# Patient Record
Sex: Male | Born: 1985 | Race: Black or African American | Hispanic: No | Marital: Married | Smoking: Former smoker
Health system: Southern US, Community
[De-identification: ages and names within clinical notes are randomized; demographics above are authoritative.]

## PROBLEM LIST (undated history)

## (undated) DIAGNOSIS — I10 Essential (primary) hypertension: Secondary | ICD-10-CM

---
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
## 2003-04-27 ENCOUNTER — Emergency Department (HOSPITAL_COMMUNITY): Admission: EM | Admit: 2003-04-27 | Discharge: 2003-04-27 | Payer: Self-pay | Admitting: Emergency Medicine

## 2003-04-27 ENCOUNTER — Encounter: Payer: Self-pay | Admitting: Emergency Medicine

## 2004-07-12 ENCOUNTER — Emergency Department (HOSPITAL_COMMUNITY): Admission: EM | Admit: 2004-07-12 | Discharge: 2004-07-12 | Payer: Self-pay | Admitting: Family Medicine

## 2004-10-24 ENCOUNTER — Emergency Department (HOSPITAL_COMMUNITY): Admission: EM | Admit: 2004-10-24 | Discharge: 2004-10-24 | Payer: Self-pay | Admitting: Family Medicine

## 2004-11-23 ENCOUNTER — Emergency Department (HOSPITAL_COMMUNITY): Admission: EM | Admit: 2004-11-23 | Discharge: 2004-11-23 | Payer: Self-pay | Admitting: Emergency Medicine

## 2005-01-22 ENCOUNTER — Emergency Department (HOSPITAL_COMMUNITY): Admission: EM | Admit: 2005-01-22 | Discharge: 2005-01-22 | Payer: Self-pay | Admitting: Emergency Medicine

## 2005-01-23 ENCOUNTER — Emergency Department (HOSPITAL_COMMUNITY): Admission: EM | Admit: 2005-01-23 | Discharge: 2005-01-23 | Payer: Self-pay | Admitting: Emergency Medicine

## 2005-05-14 IMAGING — CR DG HAND COMPLETE 3+V*R*
3 series · 3 of 3 positions shown · non-contrast
Comparison: none

CLINICAL DATA: pain and swelling status post injury
 RIGHT HAND- THREE VIEWS:

[view not recorded (1 of 3)]
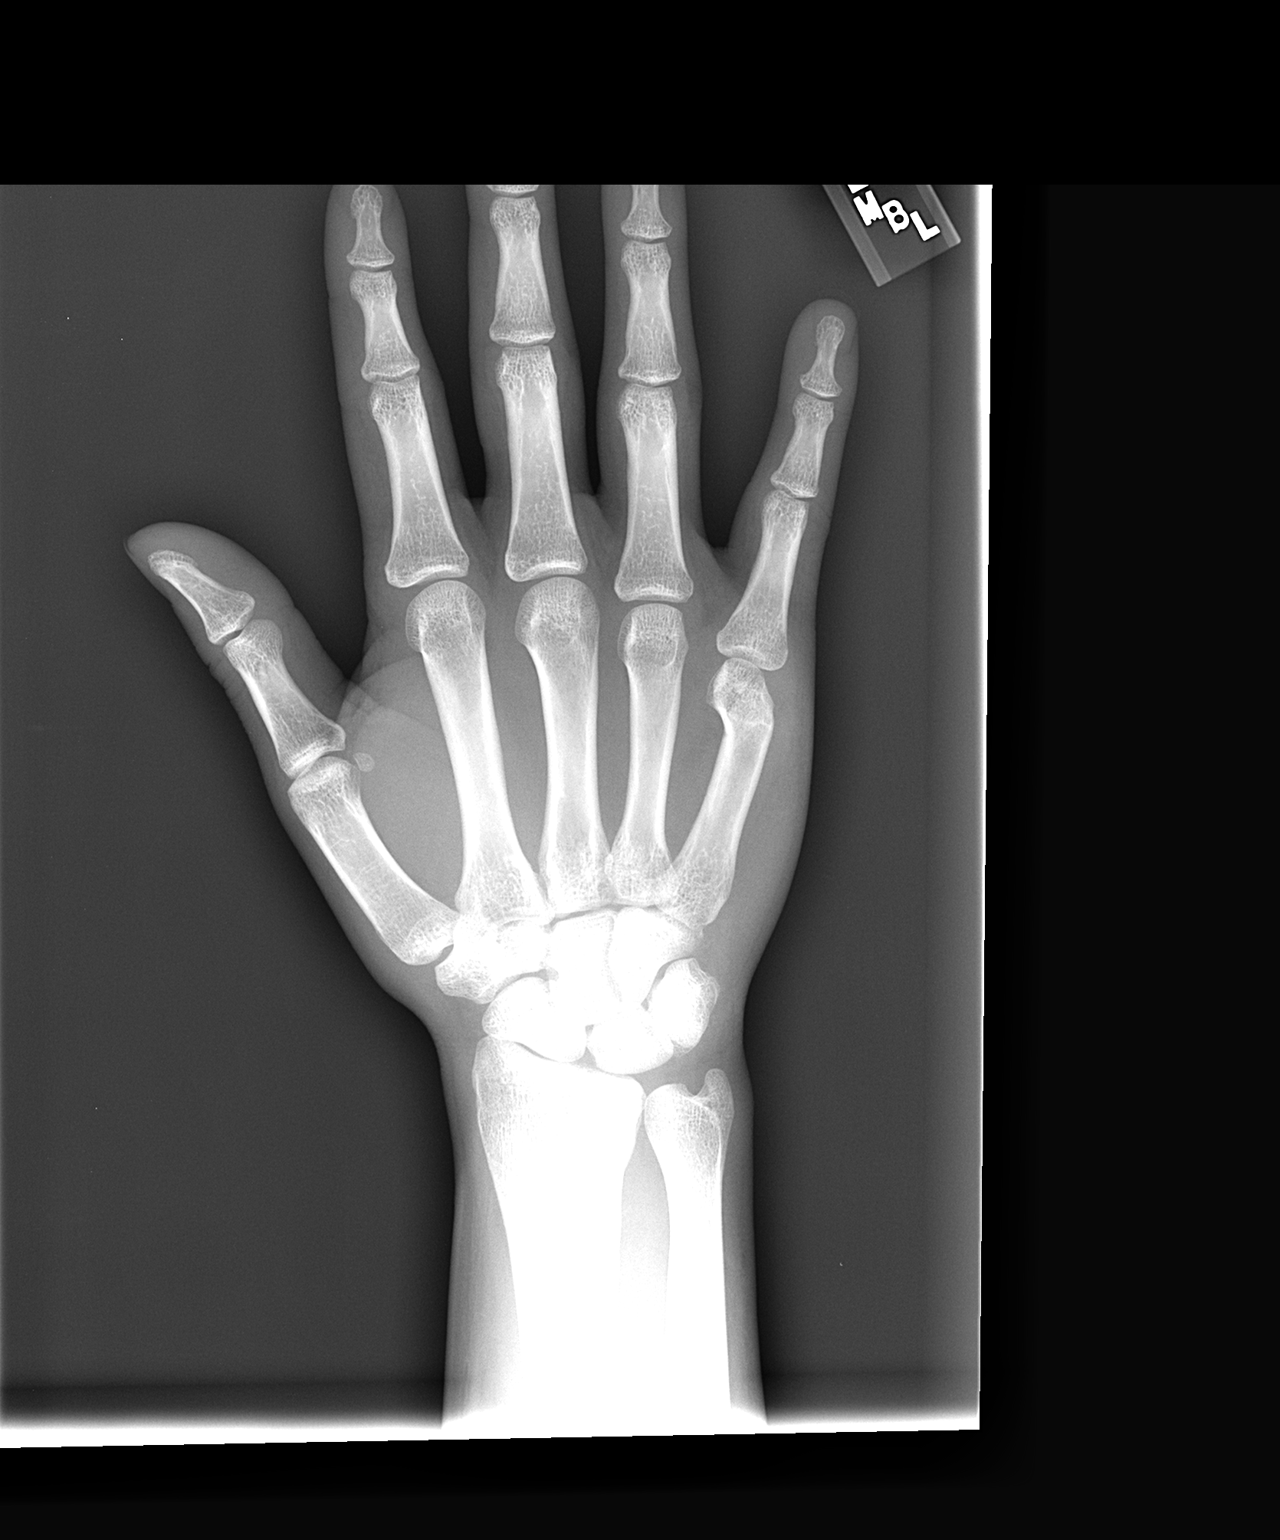

[view not recorded (2 of 3)]
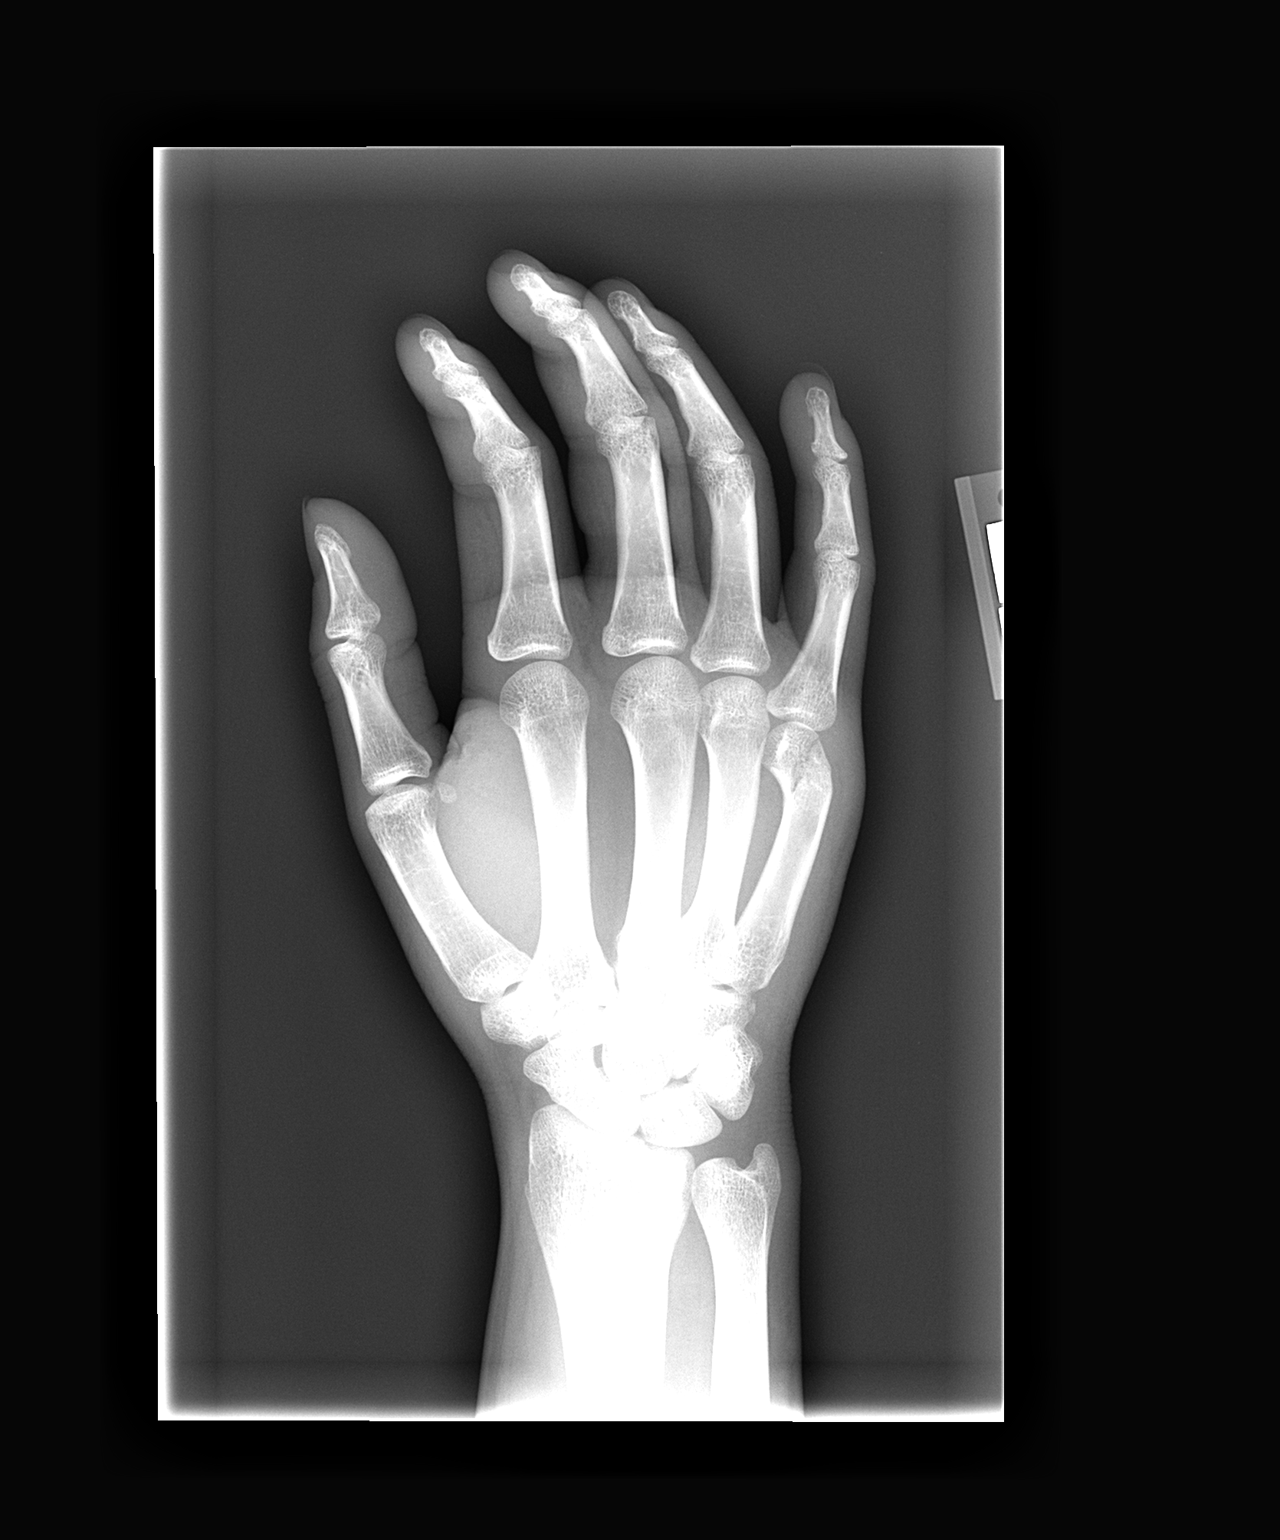

[view not recorded (3 of 3)]
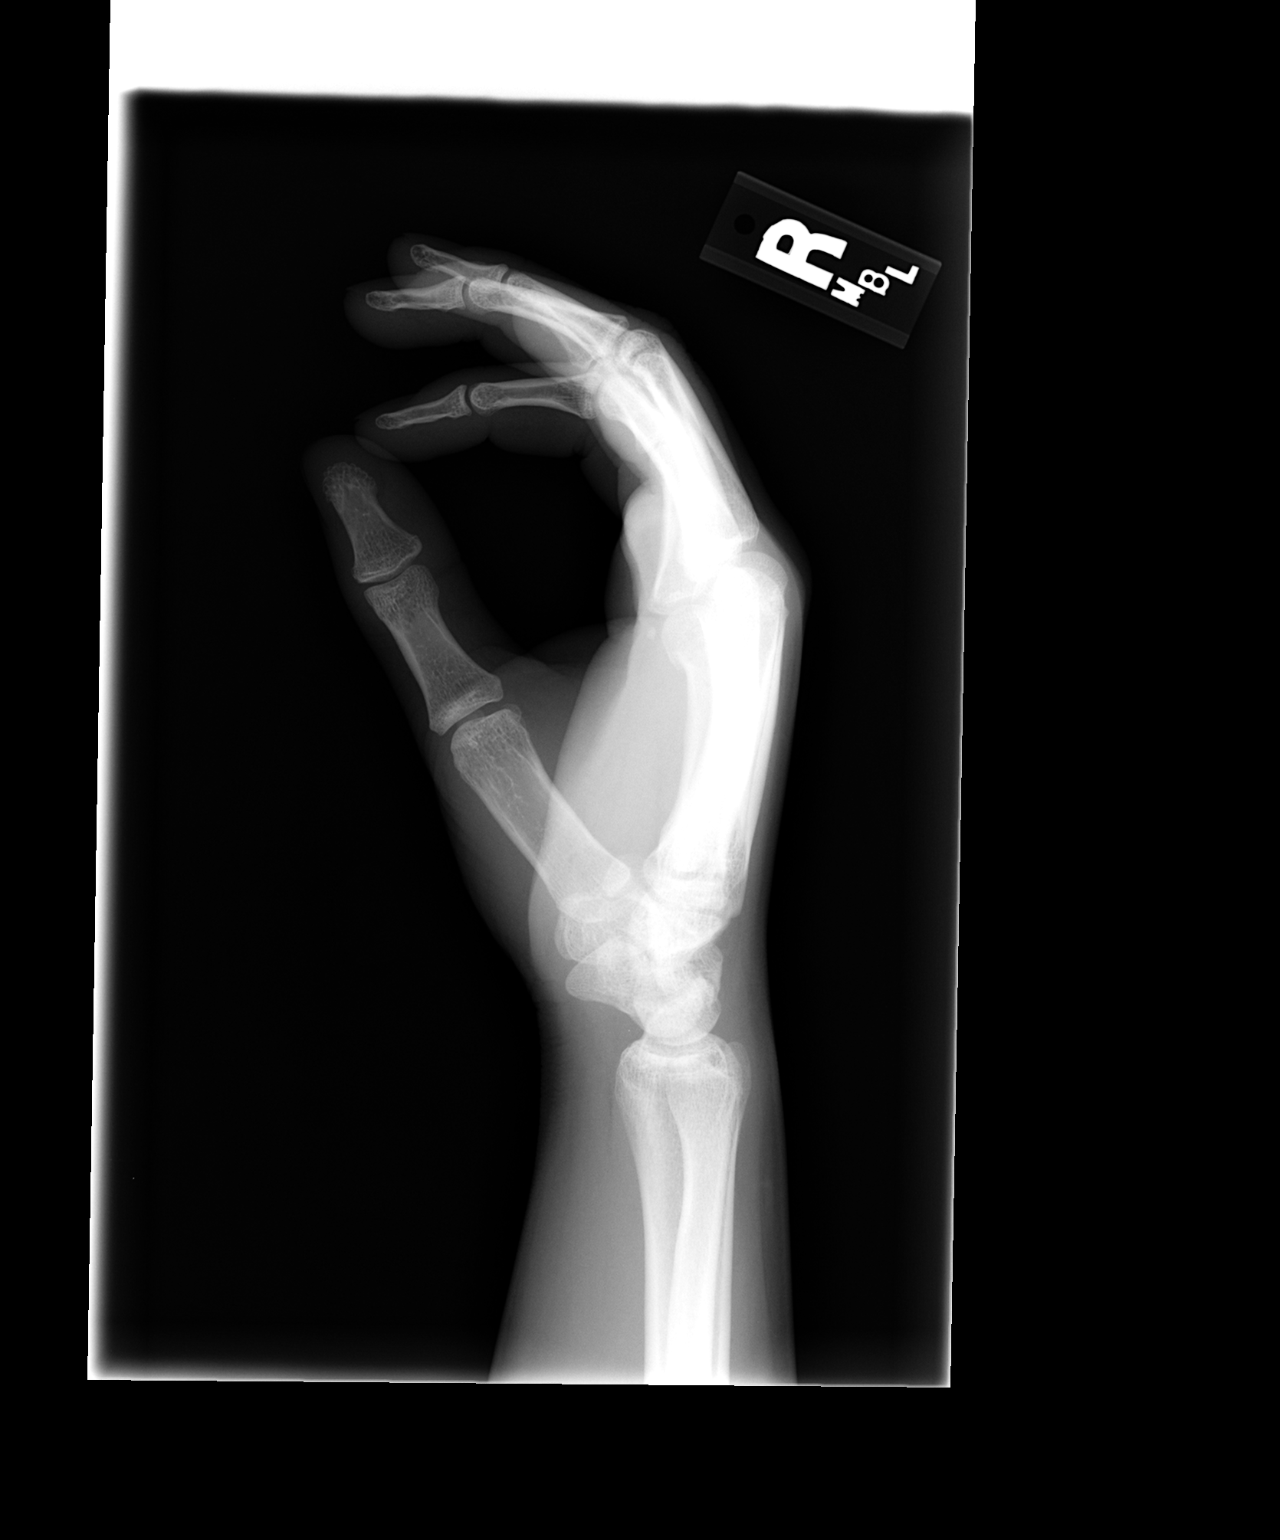

[3 of 3 positions shown; findings below may reference images not displayed]

FINDINGS: Fracture of the distal right fifth metacarpal is noted.  Mild angulation is identified.  This fracture appears to enter the MCP joint.  There is no evidence of subluxation or dislocation.
IMPRESSION: Distal fifth metacarpal fracture.

## 2005-11-20 ENCOUNTER — Emergency Department (HOSPITAL_COMMUNITY): Admission: EM | Admit: 2005-11-20 | Discharge: 2005-11-20 | Payer: Self-pay | Admitting: Family Medicine

## 2006-08-27 ENCOUNTER — Emergency Department (HOSPITAL_COMMUNITY): Admission: EM | Admit: 2006-08-27 | Discharge: 2006-08-27 | Payer: Self-pay | Admitting: Emergency Medicine

## 2011-04-15 LAB — CBC WITH AUTOMATED DIFF
ABS. BASOPHILS: 0 10*3/uL (ref 0.0–0.06)
ABS. EOSINOPHILS: 0.2 10*3/uL (ref 0.0–0.4)
ABS. LYMPHOCYTES: 1.9 10*3/uL (ref 0.9–3.6)
ABS. MONOCYTES: 0.6 10*3/uL (ref 0.05–1.2)
ABS. NEUTROPHILS: 5.4 10*3/uL (ref 1.8–8.0)
BASOPHILS: 1 % (ref 0–2)
EOSINOPHILS: 3 % (ref 0–5)
HCT: 45.1 % (ref 36.0–48.0)
HGB: 15.5 g/dL (ref 13.0–16.0)
LYMPHOCYTES: 23 % (ref 21–52)
MCH: 30 PG (ref 24.0–34.0)
MCHC: 34.4 g/dL (ref 31.0–37.0)
MCV: 87.4 FL (ref 74.0–97.0)
MONOCYTES: 7 % (ref 3–10)
MPV: 10.2 FL (ref 9.2–11.8)
NEUTROPHILS: 66 % (ref 40–73)
PLATELET: 236 10*3/uL (ref 135–420)
RBC: 5.16 M/uL (ref 4.70–5.50)
RDW: 13.5 % (ref 11.6–14.5)
WBC: 8.1 10*3/uL (ref 4.6–13.2)

## 2011-04-15 LAB — EKG, 12 LEAD, INITIAL
Atrial Rate: 59 {beats}/min
Atrial Rate: 56 {beats}/min
Calculated P Axis: 62 degrees
Calculated P Axis: 53 degrees
Calculated R Axis: 70 degrees
Calculated R Axis: 61 degrees
Calculated T Axis: 46 degrees
Calculated T Axis: 48 degrees
P-R Interval: 166 ms
P-R Interval: 158 ms
Q-T Interval: 384 ms
Q-T Interval: 388 ms
QRS Duration: 106 ms
QRS Duration: 106 ms
QTC Calculation (Bezet): 380 ms
QTC Calculation (Bezet): 374 ms
Ventricular Rate: 59 {beats}/min
Ventricular Rate: 56 {beats}/min

## 2011-04-15 LAB — METABOLIC PANEL, BASIC
Anion gap: 5 mmol/L (ref 5–15)
BUN/Creatinine ratio: 7 — ABNORMAL LOW (ref 12–20)
BUN: 8 MG/DL (ref 7–18)
CO2: 30 MMOL/L (ref 21–32)
Calcium: 9.5 MG/DL (ref 8.4–10.4)
Chloride: 102 MMOL/L (ref 100–108)
Creatinine: 1.2 MG/DL (ref 0.6–1.3)
GFR est AA: >60 mL/min/{1.73_m2} (ref >60)
GFR est non-AA: >60 mL/min/{1.73_m2} (ref >60)
Glucose: 96 MG/DL (ref 74–99)
Potassium: 3.8 MMOL/L (ref 3.5–5.5)
Sodium: 137 MMOL/L (ref 136–145)

## 2011-04-15 LAB — CARDIAC PANEL,(CK, CKMB & TROPONIN)
CK - MB: 0.9 ng/ml (ref 0.5–3.6)
CK-MB Index: 0.5 % (ref 0.0–4.0)
CK: 188 U/L (ref 39–308)
Troponin-I, QT: <0.04 NG/ML (ref 0.00–0.05)

## 2011-04-15 MED ORDER — ALBUTEROL 90 MCG/ACTUATION AEROSOL INHALER
90 mcg/actuation | RESPIRATORY_TRACT | Status: AC | PRN
Start: 2011-04-15 — End: ?

## 2011-04-15 MED ORDER — MOMETASONE 50 MCG/ACTUATION NASAL SPRAY
50 mcg/actuation | Freq: Every day | NASAL | Status: AC
Start: 2011-04-15 — End: ?

## 2011-04-15 MED ORDER — SODIUM CHLORIDE 0.9% BOLUS IV
0.9 % | Freq: Once | INTRAVENOUS | Status: AC
Start: 2011-04-15 — End: 2011-04-15
  Administered 2011-04-15: 15:00:00 via INTRAVENOUS

## 2011-04-15 MED ORDER — IPRATROPIUM-ALBUTEROL 2.5 MG-0.5 MG/3 ML NEB SOLUTION
2.5 mg-0.5 mg/3 ml | Freq: Once | RESPIRATORY_TRACT | Status: AC
Start: 2011-04-15 — End: 2011-04-15
  Administered 2011-04-15: 15:00:00 via RESPIRATORY_TRACT

## 2011-04-15 MED ORDER — PREDNISONE 50 MG TAB
50 mg | ORAL_TABLET | Freq: Every day | ORAL | Status: AC
Start: 2011-04-15 — End: 2011-04-19

## 2011-04-15 MED ORDER — ALBUTEROL SULFATE 0.083 % (0.83 MG/ML) SOLN FOR INHALATION
2.5 mg /3 mL (0.083 %) | RESPIRATORY_TRACT | Status: AC
Start: 2011-04-15 — End: 2011-04-15
  Administered 2011-04-15: 17:00:00 via RESPIRATORY_TRACT

## 2011-04-15 MED ORDER — PREDNISONE 20 MG TAB
20 mg | ORAL | Status: AC
Start: 2011-04-15 — End: 2011-04-15
  Administered 2011-04-15: 16:00:00 via ORAL

## 2011-04-15 MED ORDER — IPRATROPIUM-ALBUTEROL 2.5 MG-0.5 MG/3 ML NEB SOLUTION
2.5 mg-0.5 mg/3 ml | Freq: Two times a day (BID) | RESPIRATORY_TRACT | Status: DC
Start: 2011-04-15 — End: 2011-04-15
  Administered 2011-04-15: 14:00:00 via RESPIRATORY_TRACT

## 2011-04-15 NOTE — ED Notes (Signed)
Pt states cold type symptoms started a few days, having difficulty breathing and shortness of breath last night and worse this am;  Nonproductive cough with congestion, denies fever;

## 2011-04-15 NOTE — ED Provider Notes (Signed)
HPI Comments: 25 y.o. male presents to ED C/O difficulty breathing for the past two days. Pt reports coughing with CP, HA, rhinorrhea, and congestion. Pt Hx of nicotine use. Pt denies fever, chills, dysuria, abnormal BM, and any other Sx or complaints       Patient is a 25 y.o. male presenting with cough. The history is provided by the patient. No language interpreter was used.   Cough  This is a new problem. The current episode started 2 days ago. The problem occurs constantly. There has been no fever. Associated symptoms include chest pain, headaches, rhinorrhea and wheezing. Pertinent negatives include no sore throat, no nausea and no vomiting. Associated symptoms comments: Difficulty breathing.   Written by Lennart Pall, ED Scribe, as dictated by August Albino, MD.      No past medical history on file.     No past surgical history on file.      No family history on file.     History     Social History   ??? Marital Status: Married     Spouse Name: N/A     Number of Children: N/A   ??? Years of Education: N/A     Occupational History   ??? Not on file.     Social History Main Topics   ??? Smoking status: Not on file   ??? Smokeless tobacco: Not on file   ??? Alcohol Use: Not on file   ??? Drug Use: Not on file   ??? Sexually Active: Not on file     Other Topics Concern   ??? Not on file     Social History Narrative   ??? No narrative on file                  ALLERGIES: Aspirin and Penicillins      Review of Systems   Constitutional: Negative for fever, activity change, appetite change and unexpected weight change.   HENT: Positive for congestion and rhinorrhea. Negative for sore throat.    Respiratory: Positive for cough and wheezing.    Cardiovascular: Positive for chest pain.   Gastrointestinal: Negative for nausea, vomiting, abdominal pain and diarrhea.   Genitourinary: Negative for difficulty urinating.   Musculoskeletal: Negative for back pain.   Skin: Negative for rash.   Neurological: Positive for headaches.        Filed Vitals:    04/15/11 0901 04/15/11 0917 04/15/11 1141   BP: 143/80     Pulse: 63     Temp: 98.4 ??F (36.9 ??C)     Resp: 20     Height: 5\' 7"  (1.702 m)     Weight: 72.576 kg (160 lb)     SpO2: 100% 100% 100%            Physical Exam   Nursing note and vitals reviewed.  Constitutional: He is oriented to person, place, and time. He appears well-developed and well-nourished.   HENT:   Head: Normocephalic and atraumatic.   Right Ear: External ear normal.   Left Ear: External ear normal.   Nose: Nose normal.        rhinorrhea, and congestion  TTP frontal and maxillary sinus      Eyes: Conjunctivae and EOM are normal. Pupils are equal, round, and reactive to light.   Neck: Normal range of motion. Neck supple. No JVD present. No tracheal deviation present. No thyromegaly present.   Cardiovascular: Normal rate, regular rhythm, normal heart sounds and intact distal pulses.  Exam reveals no gallop and no friction rub.    No murmur heard.  Pulmonary/Chest: Effort normal. No respiratory distress. He has wheezes. He has no rales.        expiratory wheezing. no nasal flares.    Abdominal: Soft. Bowel sounds are normal. He exhibits no distension and no mass. There is no tenderness. There is no rebound and no guarding.   Musculoskeletal: Normal range of motion.   Neurological: He is alert and oriented to person, place, and time. He has normal reflexes. No cranial nerve deficit.   Skin: Skin is warm and dry. No rash noted. No erythema.        no erythema    Psychiatric: He has a normal mood and affect. His behavior is normal.        MDM     Differential Diagnosis; Clinical Impression; Plan:     DDx:  Amount and/or Complexity of Data Reviewed:   Clinical lab tests:  Ordered and reviewed  Tests in the radiology section of CPT??:  Ordered and reviewed (CXR)  Tests in the medicine section of the CPT??:  Ordered and reviewed (EKG)   Independant visualization of image, tracing, or specimen:  Yes      Procedures     Answered patient's questions regarding treatment plan.   Written by Lennart Pall, ED Scribe, as dictated by August Albino, MD.    ED EKG interpretation:  Sinus bradycardia with marked sinus arrhythmia 59 bpm with L ventricular hypertrophy read by Dot Lanes Brandy Kabat, MD at 1012.  Written by Lennart Pall, ED Scribe, as dictated by August Albino, MD.    ED EKG interpretation:  Sinus bradycardia 56 bpm with L ventricular hypertrophy read by Dot Lanes Kupono Marling, MD at 1245.  Written by Lennart Pall, ED Scribe, as dictated by August Albino, MD.       A Chest X-Ray was ordered. My reading of this film is no acute pulmonary process. (No comparison films available: pending review by Radiologist.)   Written by Lennart Pall, ED Scribe, as dictated by August Albino, MD.       Patients results have been reviewed with them.  Patient and/or family have verbally conveyed their understanding and agreement of the patient's signs, symptoms, diagnosis, treatment and prognosis and additionally agree to follow up as recommended or return to the Emergency Room should their condition change prior to their follow-up appointment. Patient verbally agrees with the care-plan and verbally conveys that all of their questions have been answered.   Discharge instructions have also been provided to the patient with some educational information regarding their diagnosis as well a list of reasons why they would want to return to the ER prior to their follow-up appointment should their condition change.     Labs Reviewed   METABOLIC PANEL, BASIC - Abnormal; Notable for the following:     BUN/Creatinine ratio 7 (*)     All other components within normal limits   CARDIAC PANEL,(CK, CKMB & TROPONIN)   CBC WITH AUTOMATED DIFF       Recent Results (from the past 12 hour(s))   EKG, 12 LEAD, INITIAL    Collection Time    04/15/11 10:10 AM       Component Value Range    Ventricular Rate 59      Atrial Rate 59      P-R Interval 166      QRS Duration 106       Q-T Interval 384  QTC Calculation (Bezet) 380      Calculated P Axis 62      Calculated R Axis 70      Calculated T Axis 46      Diagnosis        Value: Sinus bradycardia with marked sinus arrhythmia      Possible Right ventricular hypertrophy      Voltage criteria for left ventricular hypertrophy      ST elevation, consider early repolarization, pericarditis, or injury      Abnormal ECG      No previous ECGs available      Confirmed by [id number], Daune Perch A ([id number]) on 04/15/2011 11:07:05 AM   CARDIAC PANEL,(CK, CKMB & TROPONIN)    Collection Time    04/15/11 10:30 AM       Component Value Range    CK 188  39 - 308 (U/L)    CK - MB 0.9  0.5 - 3.6 (ng/ml)    CK-MB Index 0.5  0.0 - 4.0 (%)    Troponin-I, Qt. <0.04  0.00 - 0.05 (NG/ML)   CBC WITH AUTOMATED DIFF    Collection Time    04/15/11 10:30 AM       Component Value Range    WBC 8.1  4.6 - 13.2 (K/uL)    RBC 5.16  4.70 - 5.50 (M/uL)    HGB 15.5  13.0 - 16.0 (g/dL)    HCT 11.9  14.7 - 82.9 (%)    MCV 87.4  74.0 - 97.0 (FL)    MCH 30.0  24.0 - 34.0 (PG)    MCHC 34.4  31.0 - 37.0 (g/dL)    RDW 56.2  13.0 - 86.5 (%)    PLATELET 236  135 - 420 (K/uL)    MPV 10.2  9.2 - 11.8 (FL)    NEUTROPHILS 66  40 - 73 (%)    LYMPHOCYTES 23  21 - 52 (%)    MONOCYTES 7  3 - 10 (%)    EOSINOPHILS 3  0 - 5 (%)    BASOPHILS 1  0 - 2 (%)    ABS. NEUTROPHILS 5.4  1.8 - 8.0 (K/UL)    ABS. LYMPHOCYTES 1.9  0.9 - 3.6 (K/UL)    ABS. MONOCYTES 0.6  0.05 - 1.2 (K/UL)    ABS. EOSINOPHILS 0.2  0.0 - 0.4 (K/UL)    ABS. BASOPHILS 0.0  0.0 - 0.06 (K/UL)    DF AUTOMATED     METABOLIC PANEL, BASIC    Collection Time    04/15/11 10:30 AM       Component Value Range    Sodium 137  136 - 145 (MMOL/L)    Potassium 3.8  3.5 - 5.5 (MMOL/L)    Chloride 102  100 - 108 (MMOL/L)    CO2 30  21 - 32 (MMOL/L)    Anion gap 5  5 - 15 (mmol/L)    Glucose 96  74 - 99 (MG/DL)    BUN 8  7 - 18 (MG/DL)    Creatinine 1.2  0.6 - 1.3 (MG/DL)    BUN/Creatinine ratio 7 (*) 12 - 20 ( )     GFR est AA >60  >60 (ml/min/1.44m2)    GFR est non-AA >60  >60 (ml/min/1.66m2)    Calcium 9.5  8.4 - 10.4 (MG/DL)       CLINICAL IMPRESSION    1. Reactive airway disease    2. Nasal congestion

## 2011-04-15 NOTE — ED Notes (Signed)
I have reviewed discharge instructions with the patient.  The patient verbalized understanding. Patient armband removed and shredded

## 2013-12-16 MED ORDER — LIDOCAINE 2 % MUCOSAL SOLN
2 % | Freq: Four times a day (QID) | Status: AC | PRN
Start: 2013-12-16 — End: ?

## 2013-12-16 NOTE — ED Notes (Signed)
I have this itchy dry throat

## 2013-12-16 NOTE — ED Notes (Signed)
Discharge paperwork given to patient.  Patient verbalizes understanding.  Armband removed and shredded.   Patient educated regarding pain medication, dosage, administration and side effects.  Patient educated non-pharmacological pain intervention therapies.

## 2013-12-16 NOTE — ED Provider Notes (Signed)
HPI Comments:   3:14 PM  28 y.o. male presents to ED C/O itcy, scratchy sore throat x 1.5 weeks. Other sxs include mild dry cough. He has been using cough drops and drinking water without relief of sxs. He denies strep throat exposure, fevers, chills, nasal congestion, rhinorrhea, sneezing, chest pain, abdominal pain, and any other sxs or complaints.    Patient is a 28 y.o. male presenting with sore throat. The history is provided by the patient. No language interpreter was used.   Sore Throat   This is a new problem. The current episode started more than 1 week ago (1.5 weeks ago). The problem has not changed since onset.There has been no fever. Associated symptoms include cough (mild, dry). Pertinent negatives include no diarrhea, no vomiting, no congestion, no headaches and no shortness of breath. He has had no exposure to strep. Treatments tried: cough drops. The treatment provided no relief.   Written by Jani Files, ED Scribe, as dictated by Romero Liner, PA-C     History reviewed. No pertinent past medical history.     History reviewed. No pertinent past surgical history.      History reviewed. No pertinent family history.     History     Social History   ??? Marital Status: SINGLE     Spouse Name: N/A     Number of Children: N/A   ??? Years of Education: N/A     Occupational History   ??? Not on file.     Social History Main Topics   ??? Smoking status: Never Smoker    ??? Smokeless tobacco: Not on file   ??? Alcohol Use: 1.0 oz/week     2 Shots of liquor per week      Comment: daily drinks vodka   ??? Drug Use: No   ??? Sexual Activity: Not on file     Other Topics Concern   ??? Not on file     Social History Narrative                  ALLERGIES: Aspirin and Penicillins      Review of Systems   Constitutional: Negative for fever and chills.   HENT: Positive for sore throat. Negative for congestion and rhinorrhea.    Respiratory: Positive for cough (mild, dry). Negative for shortness of breath.    Cardiovascular:  Negative for chest pain.   Gastrointestinal: Negative for nausea, vomiting, abdominal pain and diarrhea.   Genitourinary: Negative for dysuria.   Musculoskeletal: Negative for myalgias and arthralgias.   Skin: Negative for rash.   Neurological: Negative for headaches.   Psychiatric/Behavioral: Negative for confusion.   All other systems reviewed and are negative.  Written by Jani Files, ED Scribe, as dictated by Romero Liner, PA-C    Filed Vitals:    12/16/13 1510   BP: 159/94   Pulse: 82   Temp: 97.9 ??F (36.6 ??C)   Resp: 16   Height: 5\' 6"  (1.676 m)   Weight: 70.761 kg (156 lb)   SpO2: 99%            Physical Exam   Constitutional: He appears well-developed and well-nourished. No distress.   HENT:   Head: Normocephalic and atraumatic.   Right Ear: Tympanic membrane, external ear and ear canal normal.   Left Ear: Tympanic membrane, external ear and ear canal normal.   Nose: Nose normal.   Mouth/Throat: Uvula is midline, oropharynx is clear and moist and mucous  membranes are normal. No oropharyngeal exudate, posterior oropharyngeal edema or posterior oropharyngeal erythema.   Eyes: Conjunctivae and EOM are normal. Pupils are equal, round, and reactive to light.   Neck: Trachea normal, normal range of motion and full passive range of motion without pain. Neck supple.   Cardiovascular: Normal rate, regular rhythm, normal heart sounds and normal pulses.    Pulmonary/Chest: Effort normal and breath sounds normal.   Abdominal: Soft. Normal appearance and bowel sounds are normal. There is no tenderness. There is no rebound and no guarding.   Musculoskeletal: Normal range of motion.   Neurological: He is alert. He has normal strength and normal reflexes.   Skin: Skin is warm and dry. He is not diaphoretic.   Psychiatric: He has a normal mood and affect. His speech is normal and behavior is normal. Judgment normal.   Nursing note and vitals reviewed.         RESULTS:    No orders to display        Labs Reviewed - No  data to display    No results found for this or any previous visit (from the past 12 hour(s)).    MDM  Number of Diagnoses or Management Options     Amount and/or Complexity of Data Reviewed  Clinical lab tests: ordered and reviewed  Tests in the radiology section of CPT??: ordered and reviewed        MEDICATIONS GIVEN:  Medications - No data to display    Procedures    PROGRESS NOTE:  3:14 PM  Initial assessment performed.  Written by Jani FilesJenna K Whitney, ED Scribe, as dictated by Romero Linerobert Daquavion Catala, PA-C    DISCHARGE NOTE:  Levan D Scafidi's  results have been reviewed with him.  He has been counseled regarding his diagnosis, treatment, and plan.  He verbally conveys understanding and agreement of the signs, symptoms, diagnosis, treatment and prognosis and additionally agrees to follow up as discussed.  He also agrees with the care-plan and conveys that all of his questions have been answered.  I have also provided discharge instructions for him that include: educational information regarding their diagnosis and treatment, and list of reasons why they would want to return to the ED prior to their follow-up appointment, should his condition change.    CLINICAL IMPRESSION:    1. Pharyngitis        PLAN:  1. D/C Home  2.   Current Discharge Medication List      START taking these medications    Details   lidocaine (LIDOCAINE VISCOUS) 2 % solution Take 5 mL by mouth four (4) times daily as needed for Pain. Mix with equal parts water.  Swish and swallow.  Qty: 1 Bottle, Refills: 0         CONTINUE these medications which have NOT CHANGED    Details   albuterol (PROVENTIL, VENTOLIN) 90 mcg/actuation inhaler Take 1-2 Puffs by inhalation every four (4) hours as needed for Wheezing.  Qty: 17 g, Refills: 0      mometasone (NASONEX) 50 mcg/actuation nasal spray 2 Sprays by Both Nostrils route daily.  Qty: 1 Container, Refills: 0         3.   Follow-up Information    Follow up With Details Comments Contact Info    [facility]  Schedule an appointment as soon as possible for a visit in 2 days Follow Up [address]  [place], [place] [id number]  [place] [id number]  [phone]  [facility]  As needed, If symptoms worsen [address]  [place] [id number]  [phone]          Written by Jani FilesJenna K Whitney, ED Scribe, as dictated by Romero Linerobert Jamarkis Branam, PA-C.

## 2013-12-18 LAB — STREP THROAT SCREEN: Strep Screen: NEGATIVE

## 2020-06-09 ENCOUNTER — Emergency Department (HOSPITAL_COMMUNITY)
Admission: EM | Admit: 2020-06-09 | Discharge: 2020-06-09 | Disposition: A | Discharge status: Home / Self Care | Attending: Emergency Medicine | Admitting: Emergency Medicine

## 2020-06-09 ENCOUNTER — Other Ambulatory Visit: Payer: Self-pay

## 2020-06-09 ENCOUNTER — Encounter (HOSPITAL_COMMUNITY): Payer: Self-pay | Admitting: Emergency Medicine

## 2020-06-09 DIAGNOSIS — F17200 Nicotine dependence, unspecified, uncomplicated: Secondary | ICD-10-CM | POA: Insufficient documentation

## 2020-06-09 DIAGNOSIS — T65891A Toxic effect of other specified substances, accidental (unintentional), initial encounter: Secondary | ICD-10-CM | POA: Insufficient documentation

## 2020-06-09 DIAGNOSIS — Y990 Civilian activity done for income or pay: Secondary | ICD-10-CM | POA: Insufficient documentation

## 2020-06-09 DIAGNOSIS — H10211 Acute toxic conjunctivitis, right eye: Secondary | ICD-10-CM | POA: Diagnosis not present

## 2020-06-09 DIAGNOSIS — X141XXA Other contact with hot air and other hot gases, initial encounter: Secondary | ICD-10-CM | POA: Diagnosis not present

## 2020-06-09 DIAGNOSIS — H5711 Ocular pain, right eye: Secondary | ICD-10-CM | POA: Diagnosis present

## 2020-06-09 MED ORDER — TETRACAINE HCL 0.5 % OP SOLN
2.0000 [drp] | Freq: Once | OPHTHALMIC | Status: AC
  Administered 2020-06-09: 2 [drp] via OPHTHALMIC
  Filled 2020-06-09: qty 4

## 2020-06-09 MED ORDER — FLUORESCEIN SODIUM 1 MG OP STRP
1.0000 | ORAL_STRIP | Freq: Once | OPHTHALMIC | Status: AC
  Administered 2020-06-09: 1 via OPHTHALMIC
  Filled 2020-06-09: qty 1

## 2020-06-09 MED ORDER — KETOROLAC TROMETHAMINE 0.5 % OP SOLN
1.0000 [drp] | Freq: Four times a day (QID) | OPHTHALMIC | Status: DC
  Administered 2020-06-09: 1 [drp] via OPHTHALMIC
  Filled 2020-06-09: qty 5
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
## 2020-06-09 NOTE — ED Notes (Signed)
Right eye irrigated with of normal saline
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
## 2020-06-09 NOTE — ED Notes (Signed)
Patient complains of blurry vision he believes is due to keeping his eye closed for long periods of time.

## 2021-10-23 ENCOUNTER — Other Ambulatory Visit: Payer: Self-pay

## 2021-10-23 ENCOUNTER — Emergency Department (HOSPITAL_BASED_OUTPATIENT_CLINIC_OR_DEPARTMENT_OTHER)
Admission: EM | Admit: 2021-10-23 | Discharge: 2021-10-23 | Disposition: A | Discharge status: Home / Self Care | Payer: Commercial Managed Care - PPO | Attending: Emergency Medicine | Admitting: Emergency Medicine

## 2021-10-23 ENCOUNTER — Emergency Department (HOSPITAL_BASED_OUTPATIENT_CLINIC_OR_DEPARTMENT_OTHER): Payer: Commercial Managed Care - PPO

## 2021-10-23 ENCOUNTER — Encounter (HOSPITAL_BASED_OUTPATIENT_CLINIC_OR_DEPARTMENT_OTHER): Payer: Self-pay | Admitting: *Deleted

## 2021-10-23 DIAGNOSIS — R030 Elevated blood-pressure reading, without diagnosis of hypertension: Secondary | ICD-10-CM | POA: Diagnosis not present

## 2021-10-23 DIAGNOSIS — R0789 Other chest pain: Secondary | ICD-10-CM | POA: Diagnosis not present

## 2021-10-23 DIAGNOSIS — R079 Chest pain, unspecified: Secondary | ICD-10-CM | POA: Diagnosis present

## 2021-10-23 LAB — CBC WITH DIFFERENTIAL/PLATELET
Abs Immature Granulocytes: 0.01 10*3/uL (ref 0.00–0.07)
Basophils Absolute: 0 10*3/uL (ref 0.0–0.1)
Basophils Relative: 1 %
Eosinophils Absolute: 0.1 10*3/uL (ref 0.0–0.5)
Eosinophils Relative: 1 %
HCT: 41.9 % (ref 39.0–52.0)
Hemoglobin: 14.1 g/dL (ref 13.0–17.0)
Immature Granulocytes: 0 %
Lymphocytes Relative: 45 %
Lymphs Abs: 3 10*3/uL (ref 0.7–4.0)
MCH: 29.7 pg (ref 26.0–34.0)
MCHC: 33.7 g/dL (ref 30.0–36.0)
MCV: 88.2 fL (ref 80.0–100.0)
Monocytes Absolute: 0.6 10*3/uL (ref 0.1–1.0)
Monocytes Relative: 9 %
Neutro Abs: 2.9 10*3/uL (ref 1.7–7.7)
Neutrophils Relative %: 44 %
Platelets: 318 10*3/uL (ref 150–400)
RBC: 4.75 MIL/uL (ref 4.22–5.81)
RDW: 13 % (ref 11.5–15.5)
WBC: 6.7 10*3/uL (ref 4.0–10.5)
nRBC: 0 % (ref 0.0–0.2)

## 2021-10-23 LAB — BASIC METABOLIC PANEL
Anion gap: 8 (ref 5–15)
BUN: 7 mg/dL (ref 6–20)
CO2: 25 mmol/L (ref 22–32)
Calcium: 9.1 mg/dL (ref 8.9–10.3)
Chloride: 105 mmol/L (ref 98–111)
Creatinine, Ser: 0.96 mg/dL (ref 0.61–1.24)
GFR, Estimated: >60 mL/min (ref >60)
Glucose, Bld: 97 mg/dL (ref 70–99)
Potassium: 3.7 mmol/L (ref 3.5–5.1)
Sodium: 138 mmol/L (ref 135–145)

## 2021-10-23 LAB — TROPONIN I (HIGH SENSITIVITY): Troponin I (High Sensitivity): 3 ng/L (ref <18)

## 2021-10-23 NOTE — Discharge Instructions (Signed)
Try to get follow-up with your primary care doctor to have your blood pressure checked.  Overall your work-up today is unremarkable.  Suspect that this could be a muscular process.  Recommend 800 mg ibuprofen every 8 hours as needed for pain.  Recommend 1000 mg of Tylenol every 6 hours as needed for pain. ?

## 2021-10-23 NOTE — ED Triage Notes (Signed)
His BP was checked at work last week due to pt having numbness and tingling in his left arm. No hx of HTN.  ?

## 2021-10-23 NOTE — ED Provider Notes (Signed)
?[facility] EMERGENCY DEPARTMENT ?Provider Note ? ? ?CSN: [id number] ?Arrival date & time: 10/23/21  1959 ? ?  ? ?History ? ?Chief Complaint  ?Patient presents with  ? Hypertension  ? ? ?Jacob Bender is a 36 y.o. male. ? ?Patient with no significant medical history presents with less discomfort.  Denies any shortness of breath, cough, sputum production.  May be pain worse with movement.  Pain left-sided in his chest going to his shoulder at times.  Denies any pain with deep inspiration.  No history of recent travel or surgery.  No significant cardiac history in the family.  Movement makes it worse.  Nothing makes it better. ? ?The history is provided by the patient.  ?Illness ? ?  ? ?Home Medications ?Prior to Admission medications   ?Not on File  ?   ? ?Allergies    ?Aspirin and Penicillins   ? ?Review of Systems   ?Review of Systems ? ?Physical Exam ?Updated Vital Signs ?BP (!) 156/110   Pulse 65   Temp 97.8 ?F (36.6 ?C)   Resp 13   Ht 5\' 6"  (1.676 m)   Wt 79.4 kg   SpO2 98%   BMI 28.25 kg/m?  ?Physical Exam ?Vitals and nursing note reviewed.  ?Constitutional:   ?   General: He is not in acute distress. ?   Appearance: He is well-developed. He is not ill-appearing.  ?HENT:  ?   Head: Normocephalic and atraumatic.  ?Eyes:  ?   Extraocular Movements: Extraocular movements intact.  ?   Conjunctiva/sclera: Conjunctivae normal.  ?   Pupils: Pupils are equal, round, and reactive to light.  ?Cardiovascular:  ?   Rate and Rhythm: Normal rate and regular rhythm.  ?   Pulses: Normal pulses.  ?   Heart sounds: Normal heart sounds. No murmur heard. ?Pulmonary:  ?   Effort: Pulmonary effort is normal. No respiratory distress.  ?   Breath sounds: Normal breath sounds.  ?Abdominal:  ?   Palpations: Abdomen is soft.  ?   Tenderness: There is no abdominal tenderness.  ?Musculoskeletal:     ?   General: Tenderness present. No swelling.  ?   Cervical back: Normal range of motion and neck supple.  ?Skin: ?    General: Skin is warm and dry.  ?   Capillary Refill: Capillary refill takes less than 2 seconds.  ?Neurological:  ?   General: No focal deficit present.  ?   Mental Status: He is alert and oriented to person, place, and time.  ?Psychiatric:     ?   Mood and Affect: Mood normal.  ? ? ?ED Results / Procedures / Treatments   ?Labs ?(all labs ordered are listed, but only abnormal results are displayed) ?Labs Reviewed  ?CBC WITH DIFFERENTIAL/PLATELET  ?BASIC METABOLIC PANEL  ?TROPONIN I (HIGH SENSITIVITY)  ? ? ?EKG ?EKG Interpretation ? ?Date/Time:  Tuesday October 23 2021 20:18:12 EDT ?Ventricular Rate:  72 ?PR Interval:  147 ?QRS Duration: 97 ?QT Interval:  381 ?QTC Calculation: 417 ?R Axis:   54 ?Text Interpretation: Sinus rhythm Left ventricular hypertrophy Confirmed by [id number], Royelle Hinchman ([id number]) on 10/23/2021 8:22:05 PM ? ?Radiology ?DG Chest Portable 1 View ? ?Result Date: 10/23/2021 ?CLINICAL DATA:  Left upper extremity numbness and tingling. EXAM: PORTABLE CHEST 1 VIEW COMPARISON:  PA Lat 01/23/2005 FINDINGS: The heart size and mediastinal contours are within normal limits. There is trace calcification in the aortic knob. Both lungs are clear. The visualized skeletal structures are  unremarkable. IMPRESSION: No evidence of acute chest disease.  Trace aortic atherosclerosis. Electronically Signed   By: Almira Bar M.D.   On: 10/23/2021 20:48   ? ?Procedures ?Procedures  ? ? ?Medications Ordered in ED ?Medications - No data to display ? ?ED Course/ Medical Decision Making/ A&P ?  ?                        ?Medical Decision Making ?Amount and/or Complexity of Data Reviewed ?Labs: ordered. ?Radiology: ordered. ? ? ?Jacob Bender is here with chest pain, high blood pressure.  Patient with blood pressure of 150/110.  Otherwise unremarkable vitals.  Has never had his blood pressure checked before.  EKG shows sinus rhythm.  He has what appears to be benign repolarization changes.  No obvious ischemic changes.  No concern for  pericarditis based off EKG and history and physical.  Differential diagnosis likely musculoskeletal process versus less likely ACS.  Heart score is 1.  PERC score 0 and doubt PE.  Unlikely infectious process but will get chest x-ray, basic labs including troponin. ? ?Per my review and interpretation of EKG sinus rhythm.  No ischemic changes.  ST elevations diffusely likely early repolarization.  History and physical not consistent pericarditis.  Chest x-ray per my review and interpretation shows no obvious pneumonia or pneumothorax.  Per review and interpretation of his labs there is no significant anemia, electrolyte abnormality, kidney injury.  Troponin is normal.  Pain has been ongoing for several days.  No need for repeat troponin.  Overall does not have any cardiac risk factors.  Lab work is unremarkable.  There is a muscular component to this.  We will have him trial Tylenol and ibuprofen.  We will have him follow-up with primary care doctor to have blood pressure checked again.  Discharged in good condition.  Understands return precautions. ? ?This chart was dictated using voice recognition software.  Despite best efforts to proofread,  errors can occur which can change the documentation meaning.  ? ? ? ? ? ? ? ?Final Clinical Impression(s) / ED Diagnoses ?Final diagnoses:  ?Atypical chest pain  ? ? ?Rx / DC Orders ?ED Discharge Orders   ? ? None  ? ?  ? ? ?  ?Virgina Norfolk, DO ?10/23/21 2135 ? ?

## 2021-10-23 NOTE — ED Notes (Signed)
Patient discharged to home.  All discharge instructions reviewed.  Patient verbalized understanding via teachback method.  VS WDL.  Respirations even and unlabored.  Ambulatory out of ED.   °

## 2021-10-25 ENCOUNTER — Encounter (HOSPITAL_BASED_OUTPATIENT_CLINIC_OR_DEPARTMENT_OTHER): Payer: Self-pay | Admitting: Obstetrics and Gynecology

## 2021-10-25 ENCOUNTER — Emergency Department (HOSPITAL_BASED_OUTPATIENT_CLINIC_OR_DEPARTMENT_OTHER)
Admission: EM | Admit: 2021-10-25 | Discharge: 2021-10-25 | Disposition: A | Discharge status: Home / Self Care | Payer: Commercial Managed Care - PPO | Attending: Emergency Medicine | Admitting: Emergency Medicine

## 2021-10-25 ENCOUNTER — Other Ambulatory Visit: Payer: Self-pay

## 2021-10-25 DIAGNOSIS — R531 Weakness: Secondary | ICD-10-CM | POA: Diagnosis not present

## 2021-10-25 DIAGNOSIS — R0981 Nasal congestion: Secondary | ICD-10-CM | POA: Diagnosis not present

## 2021-10-25 DIAGNOSIS — I10 Essential (primary) hypertension: Secondary | ICD-10-CM | POA: Insufficient documentation

## 2021-10-25 DIAGNOSIS — Z79899 Other long term (current) drug therapy: Secondary | ICD-10-CM | POA: Insufficient documentation

## 2021-10-25 DIAGNOSIS — R0602 Shortness of breath: Secondary | ICD-10-CM | POA: Insufficient documentation

## 2021-10-25 DIAGNOSIS — R059 Cough, unspecified: Secondary | ICD-10-CM | POA: Diagnosis not present

## 2021-10-25 DIAGNOSIS — Z20822 Contact with and (suspected) exposure to covid-19: Secondary | ICD-10-CM | POA: Diagnosis not present

## 2021-10-25 HISTORY — DX: Essential (primary) hypertension: I10

## 2021-10-25 LAB — CBC
HCT: 44.1 % (ref 39.0–52.0)
Hemoglobin: 14.5 g/dL (ref 13.0–17.0)
MCH: 29.1 pg (ref 26.0–34.0)
MCHC: 32.9 g/dL (ref 30.0–36.0)
MCV: 88.6 fL (ref 80.0–100.0)
Platelets: 333 10*3/uL (ref 150–400)
RBC: 4.98 MIL/uL (ref 4.22–5.81)
RDW: 13.2 % (ref 11.5–15.5)
WBC: 6.5 10*3/uL (ref 4.0–10.5)
nRBC: 0 % (ref 0.0–0.2)

## 2021-10-25 LAB — BASIC METABOLIC PANEL
Anion gap: 13 (ref 5–15)
BUN: 9 mg/dL (ref 6–20)
CO2: 22 mmol/L (ref 22–32)
Calcium: 9.7 mg/dL (ref 8.9–10.3)
Chloride: 102 mmol/L (ref 98–111)
Creatinine, Ser: 0.98 mg/dL (ref 0.61–1.24)
GFR, Estimated: >60 mL/min (ref >60)
Glucose, Bld: 129 mg/dL — ABNORMAL HIGH (ref 70–99)
Potassium: 3.5 mmol/L (ref 3.5–5.1)
Sodium: 137 mmol/L (ref 135–145)

## 2021-10-25 LAB — RESP PANEL BY RT-PCR (FLU A&B, COVID) ARPGX2
Influenza A by PCR: NEGATIVE
Influenza B by PCR: NEGATIVE
SARS Coronavirus 2 by RT PCR: NEGATIVE

## 2021-10-25 LAB — D-DIMER, QUANTITATIVE: D-Dimer, Quant: <0.27 ug/mL-FEU (ref 0.00–0.50)

## 2021-10-25 LAB — TROPONIN I (HIGH SENSITIVITY): Troponin I (High Sensitivity): <2 ng/L (ref <18)

## 2021-10-25 LAB — BRAIN NATRIURETIC PEPTIDE: B Natriuretic Peptide: 34.7 pg/mL (ref 0.0–100.0)

## 2021-10-25 MED ORDER — AMLODIPINE BESYLATE 5 MG PO TABS
5.0000 mg | ORAL_TABLET | Freq: Once | ORAL | Status: AC
  Administered 2021-10-25: 5 mg via ORAL
  Filled 2021-10-25: qty 1

## 2021-10-25 MED ORDER — AMLODIPINE BESYLATE 5 MG PO TABS: 5.0000 mg | ORAL_TABLET | Freq: Every day | ORAL | 1 refills | Status: DC

## 2021-10-25 NOTE — ED Triage Notes (Signed)
Patient reports to the ER for HTN and he feels like he is also ShOB. Seen recently for the same on 3/21 ?

## 2021-10-25 NOTE — Discharge Instructions (Signed)
I prescribed a medicine called amlodipine for blood pressure.  You will take your blood pressure every morning when you wake up and keep a daily diary.  If your systolic blood pressure, or top number, is over 180  mhg, or your bottom number is over 110 mmhg, you can double your dose and take an additional 5 mg of amlodipine. ? ?It is extremely important that you call to establish care with a primary care provider.  The emergency department should not be managing high blood pressure or other chronic medical issues.  Please also pay attention to your eating habits, I included an eating plan that will help with blood pressure. ?

## 2021-10-25 NOTE — ED Provider Notes (Signed)
?[facility] EMERGENCY DEPT ?Provider Note ? ? ?CSN: [id number] ?Arrival date & time: 10/25/21  1846 ? ?  ? ?History ? ?Chief Complaint  ?Patient presents with  ? Hypertension  ? ? ?Jacob Bender is a 36 y.o. male presenting to ED with hypertension, congestion, weakness.  Patient reports that he had a cough about a week ago.  He then noted that he was feeling short of breath several days ago came into the ER 3 days ago, where he had a chest x-ray, CBC BMP and troponin, which were all unremarkable.  He was discharged home at that time.  He returns today complaining that his blood pressure was quite high again at work, over 200 systolic, and he continues to feel excessively fatigued, also short of breath with exertion, and some tingling that travels down his left arm.  He denies any known medical history.  He does report a family history of hypertension but states he himself has never been diagnosed with this.  He is not currently taking any antihypertensive medications.  He does smoke marijuana but does not use any other illicit drugs.  He does eat a lot of salty and fast food. ? ?HPI ? ?  ? ?Home Medications ?Prior to Admission medications   ?Medication Sig Start Date End Date Taking? Authorizing Provider  ?amLODipine (NORVASC) 5 MG tablet Take 1 tablet (5 mg total) by mouth daily for 60 doses. 10/25/21 12/24/21 Yes Adalia Pettis, Kermit Balo, MD  ?   ? ?Allergies    ?Aspirin and Penicillins   ? ?Review of Systems   ?Review of Systems ? ?Physical Exam ?Updated Vital Signs ?BP (!) 139/94   Pulse 73   Temp 98.8 ?F (37.1 ?C)   Resp 17   Ht 5\' 6"  (1.676 m)   Wt 79.4 kg   SpO2 99%   BMI 28.25 kg/m?  ?Physical Exam ?Constitutional:   ?   General: He is not in acute distress. ?HENT:  ?   Head: Normocephalic and atraumatic.  ?Eyes:  ?   Conjunctiva/sclera: Conjunctivae normal.  ?   Pupils: Pupils are equal, round, and reactive to light.  ?Cardiovascular:  ?   Rate and Rhythm: Normal rate and regular rhythm.   ?Pulmonary:  ?   Effort: Pulmonary effort is normal. No respiratory distress.  ?   Breath sounds: Normal breath sounds.  ?Abdominal:  ?   General: There is no distension.  ?   Tenderness: There is no abdominal tenderness.  ?Skin: ?   General: Skin is warm and dry.  ?Neurological:  ?   General: No focal deficit present.  ?   Mental Status: He is alert. Mental status is at baseline.  ?Psychiatric:     ?   Mood and Affect: Mood normal.     ?   Behavior: Behavior normal.  ? ? ?ED Results / Procedures / Treatments   ?Labs ?(all labs ordered are listed, but only abnormal results are displayed) ?Labs Reviewed  ?BASIC METABOLIC PANEL - Abnormal; Notable for the following components:  ?    Result Value  ? Glucose, Bld 129 (*)   ? All other components within normal limits  ?RESP PANEL BY RT-PCR (FLU A&B, COVID) ARPGX2  ?CBC  ?D-DIMER, QUANTITATIVE  ?BRAIN NATRIURETIC PEPTIDE  ?TROPONIN I (HIGH SENSITIVITY)  ? ? ?EKG ?EKG Interpretation ? ?Date/Time:  Thursday October 25 2021 20:15:59 EDT ?Ventricular Rate:  73 ?PR Interval:  167 ?QRS Duration: 106 ?QT Interval:  367 ?QTC Calculation: 405 ?R  Axis:   54 ?Text Interpretation: Sinus rhythm Left ventricular hypertrophy  ST elevations can be consistent with BER/LVH per patient's age and habitus, no reciprocal ST changes to suggest ACS.?  No sig changes from Oct 23 2021 ecg Confirmed by Alvester Chou [phone]) on 10/25/2021 8:18:46 PM ? ?Radiology ?No results found. ? ?Procedures ?Procedures  ? ? ?Medications Ordered in ED ?Medications  ?amLODipine (NORVASC) tablet 5 mg (5 mg Oral Given 10/25/21 2019)  ? ? ?ED Course/ Medical Decision Making/ A&P ?  ?                        ?Medical Decision Making ?Amount and/or Complexity of Data Reviewed ?Labs: ordered. ?ECG/medicine tests: ordered. ? ?Risk ?Prescription drug management. ? ? ?This patient presents to the ED with concern for dyspnea on exertion, hypertension, fatigue. This involves an extensive number of treatment options, and is a  complaint that carries with it a high risk of complications and morbidity.  The differential diagnosis includes viral illness including COVID-19 versus new onset congestive heart failure versus atypical ACS versus pulmonary embolism versus symptomatic hypertension vs anemia versus other ? ?I reviewed his prior ER work-up, we can check a single troponin here as a trend, but would also like to check a D-dimer and a BNP at this point.  I do not see any clear evidence of volume overload and he does not have any acute PE risk factors, but this type of dyspnea on exertion could be consistent with PE.  He is agreeable with this testing.  We will also check a COVID and flu test as this were not done on his last presentation. ? ?Do not believe he needs another stat x-ray of the chest as his prior x-ray was reviewed and showed no focal infiltrate or pneumonia. ? ?I ordered and personally interpreted labs.  The pertinent results include:  no acute anemia, Cr wnl, electrolytes unremarkable, trop undetectable (consistent with 2 days ago), ddimer negative, BNP negative.  Lower suspicion for CHF, PE, ACS, aortic dissection.  Covid/flu negative. ? ?The patient had an xray of the chest performed 2 days ago, which I reviewed, and was unremarkable per my interpretation.  I do not believe he needs emergent repeat chest imaging as he has no new symptoms to suggest PTX, PNA, or other intrathoracic pathology. ? ?The patient was maintained on a cardiac monitor.  I personally viewed and interpreted the cardiac monitored which showed an underlying rhythm of: NSR ? ?Per my interpretation the patient's ECG shows NSR with mild ST elevations consistent with BER, also has LVH pattern which may be physiological for his age and habitus.  I do not hear prominent cardiac murmurs on exam. ? ?I ordered medication including amlodipine for HTN, with improvement of his BP in the ED.   ? ?I have reviewed the patients home medicines and have made  adjustments as needed - we will start him on amlodipine 5 mg daily, can double dose if HTN at home, and I stressed the importance of establishing PCP care. ? ?Test Considered:  ?- with negative Ddimer I have a lower suspicion for acute PE.  No signs or symptoms of stroke ?- no active chest pain or risk factors (inc family hx) for aneurysm - lower suspicion for aortic dissection.   No widened mediastinum on xray and equal pulses bilaterally. ? ?After the interventions noted above, I reevaluated the patient and found that they have: improved .  No active  symptoms on reassessment. ? ?We discussed dietary changes, lower sodium intake for his blood pressure ? ? ?Dispostion: ? ?After consideration of the diagnostic results and the patients response to treatment, I feel that the patent would benefit from PCP f/u, BP control.   ? ? ? ? ? ? ? ? ?Final Clinical Impression(s) / ED Diagnoses ?Final diagnoses:  ?Hypertension, unspecified type  ? ? ?Rx / DC Orders ?ED Discharge Orders   ? ?      Ordered  ?  amLODipine (NORVASC) 5 MG tablet  Daily       ? 10/25/21 2238  ? ?  ?  ? ?  ? ? ?  ?Terald Sleeperrifan, Niklaus Mamaril J, MD ?10/26/21 0932 ? ?

## 2021-10-30 ENCOUNTER — Ambulatory Visit: Payer: Commercial Managed Care - PPO | Attending: Critical Care Medicine | Admitting: Critical Care Medicine

## 2021-10-30 ENCOUNTER — Encounter: Payer: Self-pay | Admitting: Critical Care Medicine

## 2021-10-30 ENCOUNTER — Other Ambulatory Visit: Payer: Self-pay

## 2021-10-30 VITALS — BP 139/105 | HR 102 | Wt 175.0 lb

## 2021-10-30 DIAGNOSIS — Z139 Encounter for screening, unspecified: Secondary | ICD-10-CM | POA: Diagnosis not present

## 2021-10-30 DIAGNOSIS — Z789 Other specified health status: Secondary | ICD-10-CM

## 2021-10-30 DIAGNOSIS — Z114 Encounter for screening for human immunodeficiency virus [HIV]: Secondary | ICD-10-CM | POA: Diagnosis not present

## 2021-10-30 DIAGNOSIS — I10 Essential (primary) hypertension: Secondary | ICD-10-CM | POA: Diagnosis not present

## 2021-10-30 DIAGNOSIS — Z1159 Encounter for screening for other viral diseases: Secondary | ICD-10-CM

## 2021-10-30 MED ORDER — VALSARTAN-HYDROCHLOROTHIAZIDE 160-25 MG PO TABS: 1.0000 | ORAL_TABLET | Freq: Every day | ORAL | 3 refills | Status: AC

## 2021-10-30 MED ORDER — AMLODIPINE BESYLATE 10 MG PO TABS: 10.0000 mg | ORAL_TABLET | Freq: Every day | ORAL | 2 refills | Status: AC

## 2021-10-30 NOTE — Patient Instructions (Addendum)
Your medications were sent to your [facility] in [facility] when you go to the [facility] purchase an [employer] blood pressure meter with a regular adult cuff the pharmacist can show you the brand to obtain and check your blood pressure twice a day record the results in a diary and bring that with you at your next visit to our pharmacist Franky Macho we will see in 2 weeks ? ?Increase amlodipine to 10 mg daily ? ?Begin valsartan HCT 1 daily ? ?Both above medications were sent to your [facility] ? ?Follow the lifestyle medicine handout for diet and exercise that we discussed you need to be walking 30 minutes 5 times a week ? ?Need to reduce your alcohol intake substantially.  Recommend switching to beer and not drinking straight tequila shots as you been obtaining his this is going to cause significant dehydration and run up your blood pressure ? ?Need to have you avoid restaurant food that you are using on a regular basis at this time ? ?You will return and see our clinical pharmacist in 2 weeks for your blood pressure to be rechecked and managed in the blood pressure clinic and then you will return to see Dr. Delford Field in 2 months ? ?Health screening labs were obtained today ?

## 2021-10-30 NOTE — Assessment & Plan Note (Signed)
Hypertension not well controlled plan to increase amlodipine to 10 mg daily and begin valsartan HCT 160/25 daily ? ?Plan to check thyroid function and lipid panel this visit ? ?I went over a food as medicine diet with this patient and also an exercise program.  He may need to change his work schedule. ? ?The patient's alcohol use is significant I have given him counseling is to reduce this and switch over to something like beer and only have a couple beers when he gets home from work ?

## 2021-10-30 NOTE — Assessment & Plan Note (Signed)
This patient is drinking too much alcohol on a regular basis he will need to change over to beer and drink maybe 1-2 beers only several days a week he was given counseling and a handout ?

## 2021-10-30 NOTE — Progress Notes (Signed)
? ?New Patient Office Visit ? ?Subjective:  ?Patient ID: Jacob Bender, male    DOB: [birth year]  Age: 36 y.o. MRN: [id number] ? ?CC:  ?Chief Complaint  ?Patient presents with  ? Hospitalization Follow-up  ? ? ?HPI ?[name] presents for primary care to establish.  This a 36 year old male recently diagnosed with hypertension with 2 ER visits a week ago.  Blood pressure was in the 165/110 range in the emergency room.  He had an extensive work-up at that time as documented below. ? ? ?10/25/21 ?[name] is a 36 y.o. male presenting to ED with hypertension, congestion, weakness.  Patient reports that he had a cough about a week ago.  He then noted that he was feeling short of breath several days ago came into the ER 3 days ago, where he had a chest x-ray, CBC BMP and troponin, which were all unremarkable.  He was discharged home at that time.  He returns today complaining that his blood pressure was quite high again at work, over 200 systolic, and he continues to feel excessively fatigued, also short of breath with exertion, and some tingling that travels down his left arm.  He denies any known medical history.  He does report a family history of hypertension but states he himself has never been diagnosed with this.  He is not currently taking any antihypertensive medications.  He does smoke marijuana but does not use any other illicit drugs.  He does eat a lot of salty and fast food. ? This patient presents to the ED with concern for dyspnea on exertion, hypertension, fatigue. This involves an extensive number of treatment options, and is a complaint that carries with it a high risk of complications and morbidity.  The differential diagnosis includes viral illness including COVID-19 versus new onset congestive heart failure versus atypical ACS versus pulmonary embolism versus symptomatic hypertension vs anemia versus other ?  ?I reviewed his prior ER work-up, we can check a single troponin here as a  trend, but would also like to check a D-dimer and a BNP at this point.  I do not see any clear evidence of volume overload and he does not have any acute PE risk factors, but this type of dyspnea on exertion could be consistent with PE.  He is agreeable with this testing.  We will also check a COVID and flu test as this were not done on his last presentation. ?  ?Do not believe he needs another stat x-ray of the chest as his prior x-ray was reviewed and showed no focal infiltrate or pneumonia. ?  ?I ordered and personally interpreted labs.  The pertinent results include:  no acute anemia, Cr wnl, electrolytes unremarkable, trop undetectable (consistent with 2 days ago), ddimer negative, BNP negative.  Lower suspicion for CHF, PE, ACS, aortic dissection.  Covid/flu negative. ?  ?The patient had an xray of the chest performed 2 days ago, which I reviewed, and was unremarkable per my interpretation.  I do not believe he needs emergent repeat chest imaging as he has no new symptoms to suggest PTX, PNA, or other intrathoracic pathology. ?  ?The patient was maintained on a cardiac monitor.  I personally viewed and interpreted the cardiac monitored which showed an underlying rhythm of: NSR ?  ?Per my interpretation the patient's ECG shows NSR with mild ST elevations consistent with BER, also has LVH pattern which may be physiological for his age and habitus.  I do not hear prominent cardiac murmurs  on exam. ?  ?I ordered medication including amlodipine for HTN, with improvement of his BP in the ED.   ?  ?I have reviewed the patients home medicines and have made adjustments as needed - we will start him on amlodipine 5 mg daily, can double dose if HTN at home, and I stressed the importance of establishing PCP care. ?  ?Test Considered:  ?- with negative Ddimer I have a lower suspicion for acute PE.  No signs or symptoms of stroke ?- no active chest pain or risk factors (inc family hx) for aneurysm - lower suspicion for  aortic dissection.   No widened mediastinum on xray and equal pulses bilaterally. ?  ?After the interventions noted above, I reevaluated the patient and found that they have: improved .  No active symptoms on reassessment. ?  ?We discussed dietary changes, lower sodium intake for his blood pressure ?  ?  ?The patient's diet has been somewhat spotty.  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  He will eat a lunch/breakfast type meal at work at 7 PM which consists of restaurant fast food.  He then will take leftovers and when he gets off at 2:30 AM he will have another meal around 4 AM.  He goes to bed and then wakes up usually around 2 in the afternoon.  He will not eat a third meal. ? ?Emergency room gave him amlodipine 5 mg daily.  Today on arrival blood pressure is elevated 139/105.  He does not have a home blood pressure meter.  This patient also drinks 4-5 shots of tequila every day when he gets home at work he was started at 3 AM and continue until he goes to bed at 8 AM ?Past Medical History:  ?Diagnosis Date  ? Hypertension   ? ? ?History reviewed. No pertinent surgical history. ? ?Family History  ?Problem Relation Age of Onset  ? Heart disease Father   ? Diabetes Father   ? ? ?Social History  ? ?Socioeconomic History  ? Marital status: Married  ?  Spouse name: Not on file  ? Number of children: Not on file  ? Years of education: Not on file  ? Highest education level: Not on file  ?Occupational History  ? Not on file  ?Tobacco Use  ? Smoking status: Former  ?  Types: Cigarettes  ? Smokeless tobacco: Never  ?Vaping Use  ? Vaping Use: Never used  ?Substance and Sexual Activity  ? Alcohol use: Yes  ?  Alcohol/week: 4.0 standard drinks  ?  Types: 4 Shots of liquor per week  ? Drug use: Yes  ?  Frequency: 3.0 times per week  ?  Types: Marijuana  ? Sexual activity: Yes  ?Other Topics Concern  ? Not on file  ?Social History Narrative  ? Not on file  ? ?Social Determinants of Health   ? ?Financial Resource Strain: Not on file  ?Food Insecurity: Not on file  ?Transportation Needs: Not on file  ?Physical Activity: Not on file  ?Stress: Not on file  ?Social Connections: Not on file  ?Intimate Partner Violence: Not on file  ? ? ?ROS ?Review of Systems  ?Constitutional:  Positive for fatigue. Negative for chills, diaphoresis and fever.  ?HENT:  Negative for congestion, hearing loss, nosebleeds, sore throat and tinnitus.   ?Eyes:  Negative for photophobia and redness.  ?Respiratory:  Positive for cough, shortness of breath and wheezing. Negative for  stridor.   ?Cardiovascular:  Negative for chest pain, palpitations and leg swelling.  ?Gastrointestinal:  Negative for abdominal pain, blood in stool, constipation, diarrhea, nausea and vomiting.  ?Endocrine: Negative for polydipsia, polyphagia and polyuria.  ?Genitourinary: Negative.  Negative for dysuria, flank pain, frequency, hematuria and urgency.  ?Musculoskeletal:  Negative for back pain, myalgias and neck pain.  ?Skin:  Negative for rash.  ?Allergic/Immunologic: Negative for environmental allergies.  ?Neurological:  Negative for dizziness, tremors, seizures, weakness and headaches.  ?Hematological:  Does not bruise/bleed easily.  ?Psychiatric/Behavioral: Negative.  Negative for agitation, behavioral problems, confusion, decreased concentration, dysphoric mood, hallucinations, self-injury, sleep disturbance and suicidal ideas. The patient is not nervous/anxious and is not hyperactive.   ? ?Objective:  ? ?Today's Vitals: BP (!) 139/105   Pulse (!) 102   Wt 175 lb (79.4 kg)   SpO2 97%   BMI 28.25 kg/m?  ? ?Physical Exam ?Vitals reviewed.  ?Constitutional:   ?   Appearance: Normal appearance. He is well-developed. He is not diaphoretic.  ?HENT:  ?   Head: Normocephalic and atraumatic.  ?   Nose: No nasal deformity, septal deviation, mucosal edema or rhinorrhea.  ?   Right Sinus: No maxillary sinus tenderness or frontal sinus tenderness.  ?   Left  Sinus: No maxillary sinus tenderness or frontal sinus tenderness.  ?   Mouth/Throat:  ?   Pharynx: No oropharyngeal exudate.  ?Eyes:  ?   General: No scleral icterus. ?   Conjunctiva/sclera: Conjunctivae normal.

## 2021-10-31 LAB — HCV INTERPRETATION

## 2021-10-31 LAB — LIPID PANEL
Chol/HDL Ratio: 2.8 ratio (ref 0.0–5.0)
Cholesterol, Total: 201 mg/dL — ABNORMAL HIGH (ref 100–199)
HDL: 71 mg/dL (ref >39)
LDL Chol Calc (NIH): 113 mg/dL — ABNORMAL HIGH (ref 0–99)
Triglycerides: 97 mg/dL (ref 0–149)
VLDL Cholesterol Cal: 17 mg/dL (ref 5–40)

## 2021-10-31 LAB — HEMOGLOBIN A1C
Est. average glucose Bld gHb Est-mCnc: 117 mg/dL
Hgb A1c MFr Bld: 5.7 % — ABNORMAL HIGH (ref 4.8–5.6)

## 2021-10-31 LAB — THYROID PANEL WITH TSH
Free Thyroxine Index: 2.6 (ref 1.2–4.9)
T3 Uptake Ratio: 29 % (ref 24–39)
T4, Total: 9.1 ug/dL (ref 4.5–12.0)
TSH: 0.982 u[IU]/mL (ref 0.450–4.500)

## 2021-10-31 LAB — HCV AB W REFLEX TO QUANT PCR: HCV Ab: NONREACTIVE

## 2021-10-31 LAB — HIV ANTIBODY (ROUTINE TESTING W REFLEX): HIV Screen 4th Generation wRfx: NONREACTIVE

## 2021-11-01 ENCOUNTER — Telehealth: Payer: Self-pay

## 2021-11-01 NOTE — Telephone Encounter (Signed)
Pt was called and vm was left, Information has been sent to nurse pool.   

## 2021-11-01 NOTE — Telephone Encounter (Signed)
-----   Message from Storm Frisk, MD sent at 10/31/2021  6:39 AM EDT ----- ?Let pt know A1C slightly high : prediabetes, no medications: follow healthy diet and exercise lifestyle handout, cholesterol high : again follow lifestyle directions, hiv , hep C neg, thyroid normal ?

## 2021-11-16 ENCOUNTER — Ambulatory Visit: Payer: Commercial Managed Care - PPO | Admitting: Pharmacist

## 2022-01-14 ENCOUNTER — Ambulatory Visit: Payer: Commercial Managed Care - PPO | Admitting: Critical Care Medicine

## 2022-08-25 IMAGING — DX DG CHEST 1V PORT
1 series · 1 of 1 positions shown · non-contrast
Comparison: PA Lat 01/23/2005

CLINICAL DATA: Left upper extremity numbness and tingling.

EXAM:
PORTABLE CHEST 1 VIEW

[chest ap]
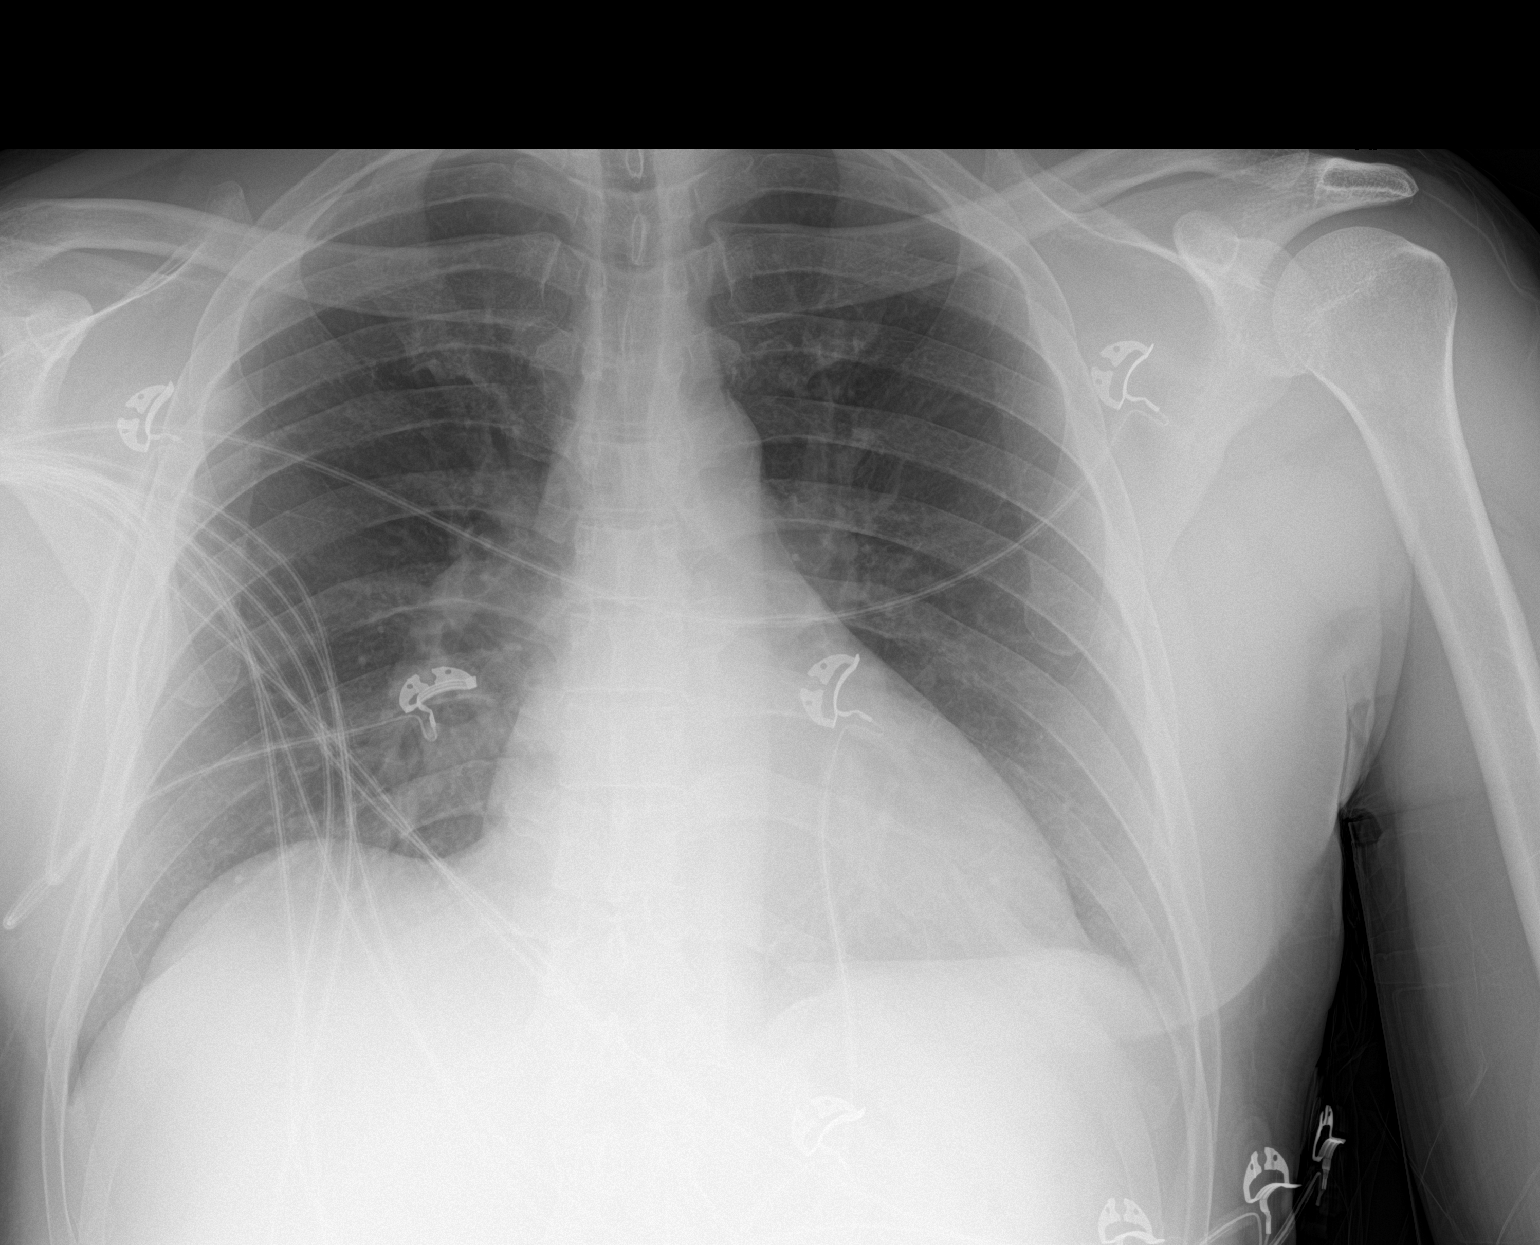

[1 of 1 positions shown; findings below may reference images not displayed]

FINDINGS: The heart size and mediastinal contours are within normal limits.
There is trace calcification in the aortic knob. Both lungs are
clear. The visualized skeletal structures are unremarkable.
IMPRESSION: No evidence of acute chest disease.  Trace aortic atherosclerosis.
